# Patient Record
Sex: Male | Born: 1965 | Race: White | Hispanic: No | Marital: Single | Smoking: Never smoker
Health system: Southern US, Community
[De-identification: ages and names within clinical notes are randomized; demographics above are authoritative.]

## PROBLEM LIST (undated history)

## (undated) DIAGNOSIS — G5600 Carpal tunnel syndrome, unspecified upper limb: Secondary | ICD-10-CM

## (undated) HISTORY — PX: BACK SURGERY: SHX140

---

## 2015-08-10 ENCOUNTER — Emergency Department (HOSPITAL_BASED_OUTPATIENT_CLINIC_OR_DEPARTMENT_OTHER): Payer: No Typology Code available for payment source

## 2015-08-10 ENCOUNTER — Encounter (HOSPITAL_BASED_OUTPATIENT_CLINIC_OR_DEPARTMENT_OTHER): Payer: Self-pay

## 2015-08-10 ENCOUNTER — Emergency Department (HOSPITAL_BASED_OUTPATIENT_CLINIC_OR_DEPARTMENT_OTHER)
Admission: EM | Admit: 2015-08-10 | Discharge: 2015-08-11 | Disposition: A | Discharge status: Home / Self Care | Payer: No Typology Code available for payment source | Attending: Emergency Medicine | Admitting: Emergency Medicine

## 2015-08-10 DIAGNOSIS — Y939 Activity, unspecified: Secondary | ICD-10-CM | POA: Diagnosis not present

## 2015-08-10 DIAGNOSIS — M79605 Pain in left leg: Secondary | ICD-10-CM | POA: Diagnosis not present

## 2015-08-10 DIAGNOSIS — Y999 Unspecified external cause status: Secondary | ICD-10-CM | POA: Diagnosis not present

## 2015-08-10 DIAGNOSIS — Y92410 Unspecified street and highway as the place of occurrence of the external cause: Secondary | ICD-10-CM | POA: Insufficient documentation

## 2015-08-10 DIAGNOSIS — M545 Low back pain: Secondary | ICD-10-CM | POA: Diagnosis present

## 2015-08-10 DIAGNOSIS — S39012A Strain of muscle, fascia and tendon of lower back, initial encounter: Secondary | ICD-10-CM | POA: Insufficient documentation

## 2015-08-10 MED ORDER — HYDROMORPHONE HCL 1 MG/ML IJ SOLN
1.0000 mg | Freq: Once | INTRAMUSCULAR | Status: AC
  Administered 2015-08-10: 1 mg via INTRAMUSCULAR
  Filled 2015-08-10: qty 1

## 2015-08-10 MED ORDER — ORPHENADRINE CITRATE ER 100 MG PO TB12: 100.0000 mg | ORAL_TABLET | Freq: Two times a day (BID) | ORAL | Status: AC

## 2015-08-10 MED ORDER — CYCLOBENZAPRINE HCL 10 MG PO TABS
10.0000 mg | ORAL_TABLET | Freq: Once | ORAL | Status: AC
  Administered 2015-08-11: 10 mg via ORAL
  Filled 2015-08-10: qty 1

## 2015-08-10 MED ORDER — IBUPROFEN 800 MG PO TABS
800.0000 mg | ORAL_TABLET | Freq: Once | ORAL | Status: AC
  Administered 2015-08-11: 800 mg via ORAL
  Filled 2015-08-10: qty 1

## 2015-08-10 MED ORDER — NAPROXEN 500 MG PO TABS: 500.0000 mg | ORAL_TABLET | Freq: Two times a day (BID) | ORAL | Status: AC
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
## 2015-08-10 NOTE — Discharge Instructions (Signed)
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
## 2015-08-10 NOTE — ED Notes (Signed)
Edwin Barker, Girlfriend, gave us [phone], which is her home phone. She is going home at this time and would like to be called when we have all the results.

## 2015-08-11 MED ORDER — CYCLOBENZAPRINE HCL 10 MG PO TABS: 10.0000 mg | ORAL_TABLET | Freq: Two times a day (BID) | ORAL | Status: AC | PRN

## 2015-08-14 ENCOUNTER — Telehealth: Payer: Self-pay | Admitting: Emergency Medicine
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
## 2017-08-14 ENCOUNTER — Encounter (HOSPITAL_COMMUNITY): Payer: Self-pay

## 2017-08-14 ENCOUNTER — Emergency Department (HOSPITAL_COMMUNITY): Payer: BLUE CROSS/BLUE SHIELD

## 2017-08-14 ENCOUNTER — Other Ambulatory Visit: Payer: Self-pay

## 2017-08-14 ENCOUNTER — Emergency Department (HOSPITAL_COMMUNITY)
Admission: EM | Admit: 2017-08-14 | Discharge: 2017-08-14 | Disposition: A | Discharge status: Home / Self Care | Payer: BLUE CROSS/BLUE SHIELD | Attending: Emergency Medicine | Admitting: Emergency Medicine

## 2017-08-14 DIAGNOSIS — Z043 Encounter for examination and observation following other accident: Secondary | ICD-10-CM | POA: Insufficient documentation

## 2017-08-14 DIAGNOSIS — W19XXXA Unspecified fall, initial encounter: Secondary | ICD-10-CM

## 2017-08-14 HISTORY — DX: Carpal tunnel syndrome, unspecified upper limb: G56.00

## 2017-08-14 LAB — URINALYSIS, ROUTINE W REFLEX MICROSCOPIC
BACTERIA UA: NONE SEEN
Bilirubin Urine: NEGATIVE
Hgb urine dipstick: NEGATIVE
KETONES UR: NEGATIVE mg/dL
LEUKOCYTES UA: NEGATIVE
Nitrite: NEGATIVE
PROTEIN: NEGATIVE mg/dL
Specific Gravity, Urine: 1.031 — ABNORMAL HIGH (ref 1.005–1.030)
pH: 7 (ref 5.0–8.0)

## 2017-08-14 MED ORDER — TRAMADOL HCL 50 MG PO TABS: 50.0000 mg | ORAL_TABLET | Freq: Four times a day (QID) | ORAL | 0 refills | Status: AC | PRN

## 2017-08-14 MED ORDER — IBUPROFEN 400 MG PO TABS: 400.0000 mg | ORAL_TABLET | Freq: Three times a day (TID) | ORAL | 0 refills | Status: AC

## 2017-08-14 MED ORDER — KETOROLAC TROMETHAMINE 30 MG/ML IJ SOLN
15.0000 mg | Freq: Once | INTRAMUSCULAR | Status: AC
Start: 2017-08-14 — End: 2017-08-14
  Administered 2017-08-14: 15 mg via INTRAVENOUS
  Filled 2017-08-14: qty 1

## 2017-08-14 NOTE — ED Triage Notes (Signed)
Pt was in the shower at a hotel when he slipped and fell out the shower. Denies hitting head, pt said he braced himself with his hands and head landed on top his hands. Denies dizziness or weakness before the fall. Pt complaint of back pain 7/10.

## 2017-08-14 NOTE — ED Provider Notes (Signed)
[facility] EMERGENCY DEPARTMENT Provider Note   CSN: [id number] Arrival date & time: 08/14/17  0049     History   Chief Complaint Chief Complaint  Patient presents with  . Fall    HPI Deklin Bieler is a 52 y.o. male.  HPI Patient presents with pain in the right lower back. Patient was in his usual state of health until last fall that occurred just prior to calling EMS. Patient was in the bathtub, slipped, fell striking his right lower back against the edge. Since that time he has had sore, severe pain in that area. No medication taken for pain relief He was able to stand, bear weight, but notes that the pain was worse with upright positioning, and attempted ambulation. No head trauma that was substantial, no loss of consciousness, and no distal extremity weakness in any of his extremities, particularly his right leg.  Past Medical History:  Diagnosis Date  . Carpal tunnel syndrome     There are no active problems to display for this patient.   Past Surgical History:  Procedure Laterality Date  . BACK SURGERY          Home Medications    Prior to Admission medications   Medication Sig Start Date End Date Taking? Authorizing Provider  cyclobenzaprine (FLEXERIL) 10 MG tablet Take 1 tablet (10 mg total) by mouth 2 (two) times daily as needed for muscle spasms. Patient not taking: Reported on 08/14/2017 08/11/15   Ward, Chase Picket, PA-C  naproxen (NAPROSYN) 500 MG tablet Take 1 tablet (500 mg total) by mouth 2 (two) times daily. Patient not taking: Reported on 08/14/2017 08/10/15   Arby Barrette, MD  orphenadrine (NORFLEX) 100 MG tablet Take 1 tablet (100 mg total) by mouth 2 (two) times daily. Patient not taking: Reported on 08/14/2017 08/10/15   Arby Barrette, MD    Family History No family history on file.  Social History Social History   Tobacco Use  . Smoking status: Never Smoker  Substance Use Topics  . Alcohol use: No  . Drug  use: Never     Allergies   Pollen extract   Review of Systems Review of Systems  Constitutional:       Per HPI, otherwise negative  HENT:       Per HPI, otherwise negative  Respiratory:       Per HPI, otherwise negative  Cardiovascular:       Per HPI, otherwise negative  Gastrointestinal: Negative for vomiting.  Endocrine:       Negative aside from HPI  Genitourinary:       Neg aside from HPI   Musculoskeletal:       Per HPI, otherwise negative  Skin: Negative.   Neurological: Negative for syncope.     Physical Exam Updated Vital Signs BP 135/89   Pulse (!) 55   Temp 97.9 F (36.6 C) (Oral)   Resp 17   Ht  (1.778 m)   Wt 111.1 kg (245 lb)   SpO2 98%   BMI 35.15 kg/m   Physical Exam  Constitutional: He is oriented to person, place, and time. He appears well-developed. No distress.  HENT:  Head: Normocephalic and atraumatic.  Eyes: Conjunctivae and EOM are normal.  Cardiovascular: Normal rate and regular rhythm.  Pulmonary/Chest: Effort normal. No stridor. No respiratory distress.  Abdominal: He exhibits no distension.  Musculoskeletal: He exhibits no edema.       Arms: The patient hesitantly, due to pain in  his right lower  back, but there is no appreciable deformity, and he does move the ankle freely  Neurological: He is alert and oriented to person, place, and time.  Skin: Skin is warm and dry.  Psychiatric: He has a normal mood and affect.  Nursing note and vitals reviewed.    ED Treatments / Results  Labs (all labs ordered are listed, but only abnormal results are displayed) Labs Reviewed  URINALYSIS, ROUTINE W REFLEX MICROSCOPIC - Abnormal; Notable for the following components:      Result Value   Specific Gravity, Urine 1.031 (*)    Glucose, UA >=500 (*)    All other components within normal limits    EKG None  Radiology Dg Lumbar Spine 2-3 Views  Result Date: 08/14/2017 CLINICAL DATA:  Status post fall onto right lower back in  bathtub. Initial encounter. EXAM: LUMBAR SPINE - 2-3 VIEW COMPARISON:  CT of the lumbar spine performed 08/10/2015 FINDINGS: There is no evidence of fracture or subluxation. Vertebral bodies demonstrate normal height and alignment. Intervertebral disc spaces are preserved. The visualized neural foramina are grossly unremarkable in appearance. The visualized bowel gas pattern is unremarkable in appearance; air and stool are noted within the colon. The sacroiliac joints are within normal limits. IMPRESSION: No evidence of fracture or subluxation along the lumbar spine. Electronically Signed   By: Roanna Raider M.D.   On: 08/14/2017 02:50    Procedures Procedures (including critical care time)  Medications Ordered in ED Medications  ketorolac (TORADOL) 30 MG/ML injection 15 mg (15 mg Intravenous Given 08/14/17 0212)     Initial Impression / Assessment and Plan / ED Course  I have reviewed the triage vital signs and the nursing notes.  Pertinent labs & imaging results that were available during my care of the patient were reviewed by me and considered in my medical decision making (see chart for details).     5:21 AM On repeat exam patient is awake and alert, has had some improvement in his pain. No hemodynamic instability. We reviewed his urinalysis, notable for a glucose urea, but absent blood, there is low suspicion for renal injury. Patient is soft, non-peritoneal abdomen, low suspicion for intra-abdominal injury. With reassuring x-ray, no distal neurovascular effects, the patient likely has contusion. Patient will be discharged in stable condition with ongoing pain management.   Final Clinical Impressions(s) / ED Diagnoses  Fall, initial encounter   Gerhard Munch, MD 08/14/17 [phone]

## 2017-08-14 NOTE — Discharge Instructions (Signed)
As discussed, today's evaluation has been generally reassuring, but you are likely to have soreness for the next few days. Please monitor your condition carefully, take medication as directed, and use ice for additional relief. It is important he follow-up with your primary care physician, and  discuss today's studies, including urinalysis concerning for possible prediabetes. Return here for concerning changes in your condition.

## 2018-12-31 IMAGING — DX DG LUMBAR SPINE 2-3V
3 series · 3 of 3 positions shown · non-contrast
Comparison: CT of the lumbar spine performed 08/10/2015

CLINICAL DATA: Status post fall onto right lower back in bathtub.
Initial encounter.

EXAM:
LUMBAR SPINE - 2-3 VIEW

[l-spine ap]
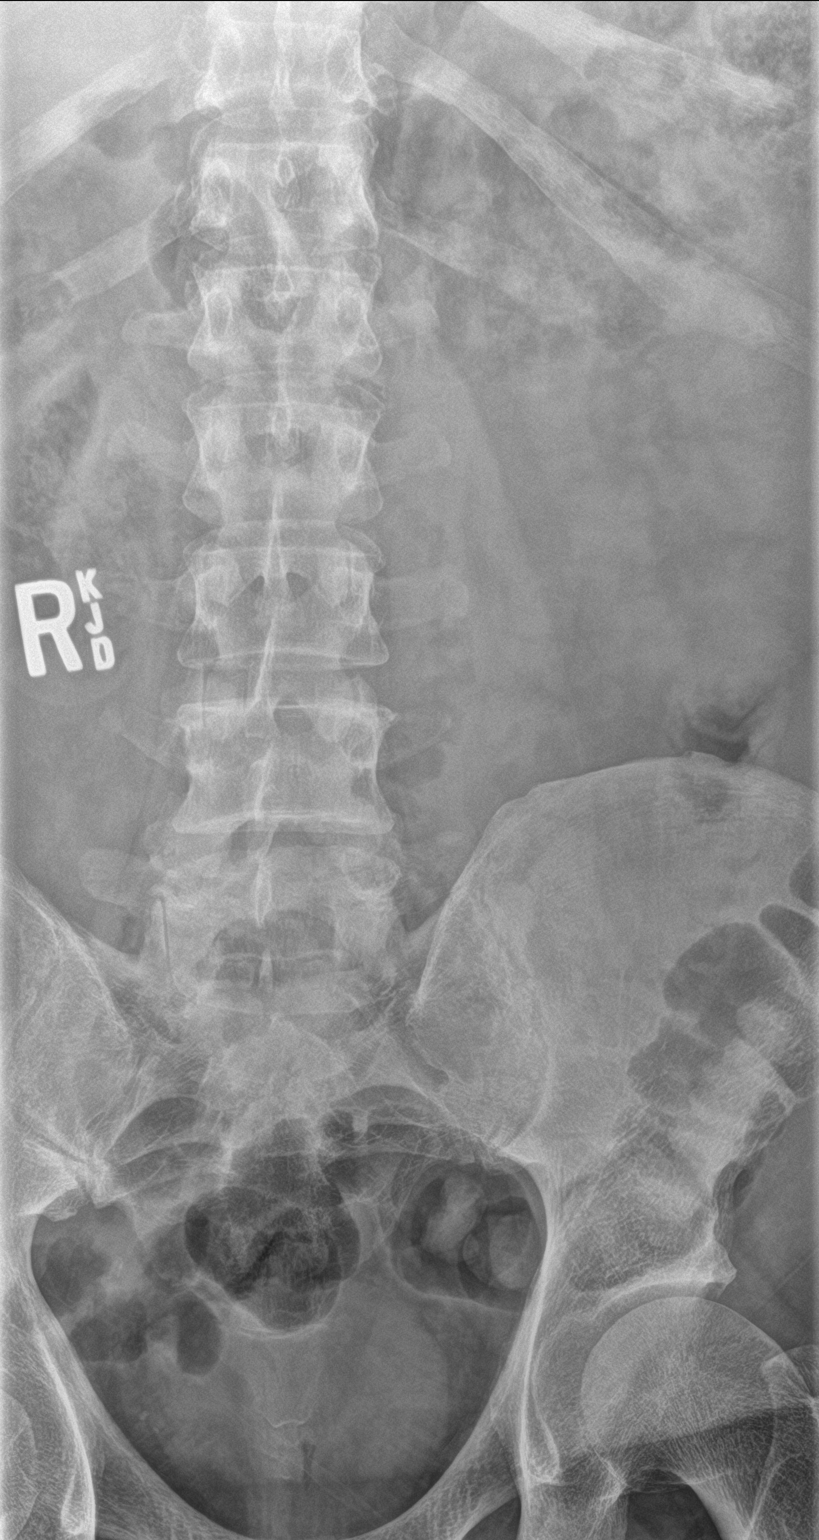

[l-spine lat]
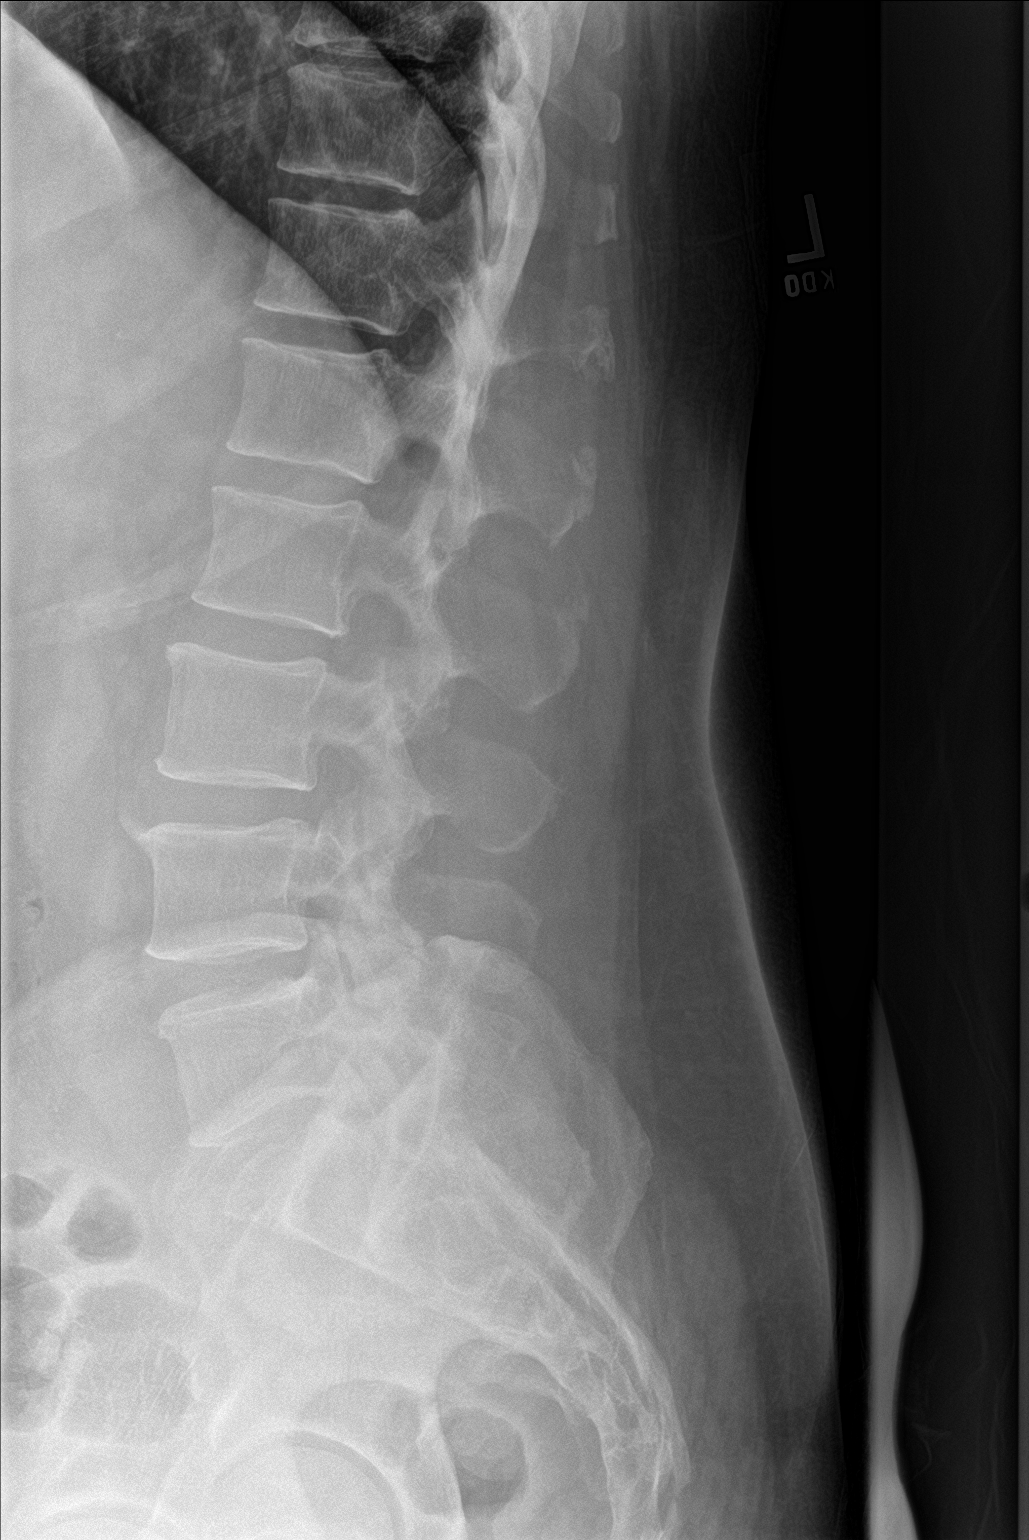

[l-spine spot]
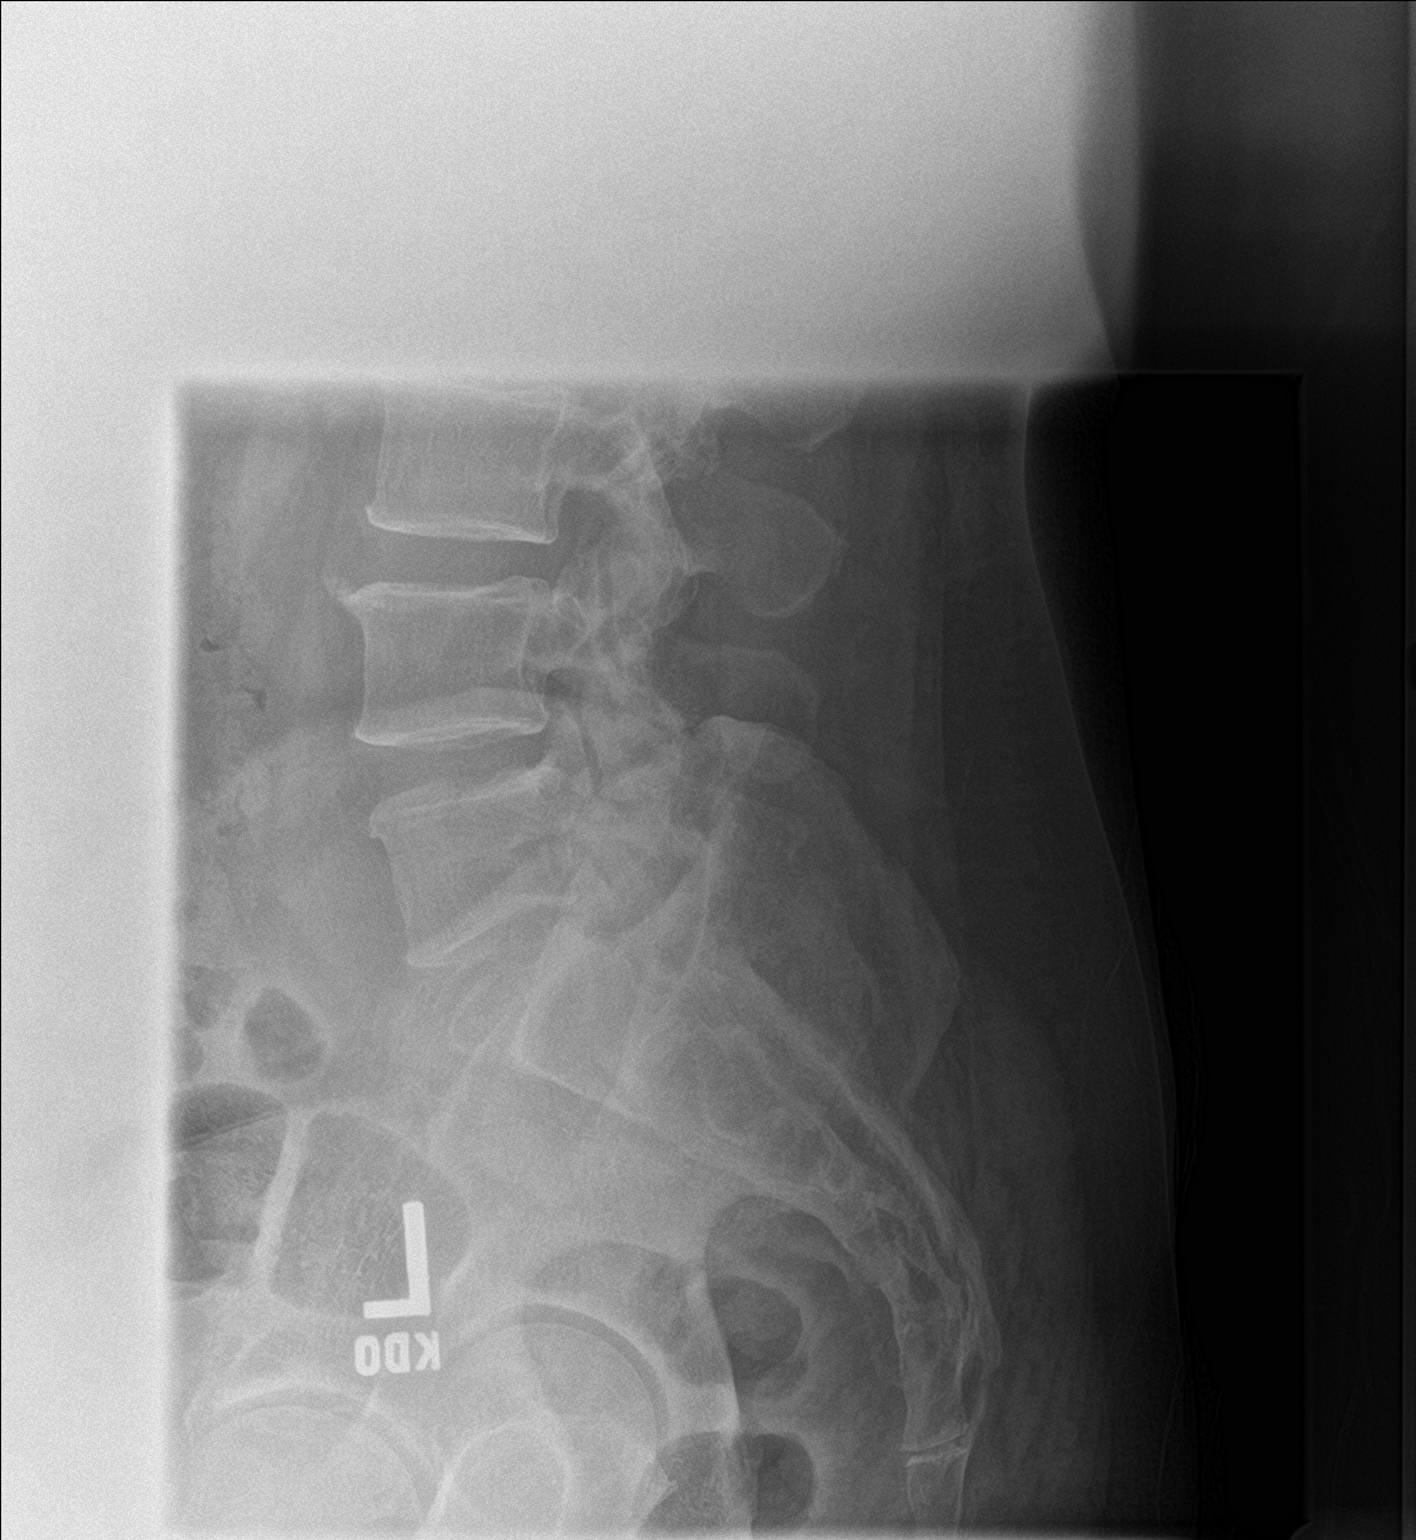

[3 of 3 positions shown; findings below may reference images not displayed]

FINDINGS: There is no evidence of fracture or subluxation. Vertebral bodies
demonstrate normal height and alignment. Intervertebral disc spaces
are preserved. The visualized neural foramina are grossly
unremarkable in appearance.

The visualized bowel gas pattern is unremarkable in appearance; air
and stool are noted within the colon. The sacroiliac joints are
within normal limits.
IMPRESSION: No evidence of fracture or subluxation along the lumbar spine.
